# Patient Record
Sex: Female | Born: 1990 | Race: White | Hispanic: No | Marital: Single | Smoking: Former smoker
Health system: Southern US, Community
[De-identification: ages and names within clinical notes are randomized; demographics above are authoritative.]

## PROBLEM LIST (undated history)

## (undated) ENCOUNTER — Inpatient Hospital Stay: Payer: Self-pay

## (undated) DIAGNOSIS — N189 Chronic kidney disease, unspecified: Secondary | ICD-10-CM

## (undated) DIAGNOSIS — F419 Anxiety disorder, unspecified: Secondary | ICD-10-CM

## (undated) DIAGNOSIS — F1910 Other psychoactive substance abuse, uncomplicated: Secondary | ICD-10-CM

## (undated) DIAGNOSIS — F32A Depression, unspecified: Secondary | ICD-10-CM

## (undated) DIAGNOSIS — F329 Major depressive disorder, single episode, unspecified: Secondary | ICD-10-CM

## (undated) DIAGNOSIS — F99 Mental disorder, not otherwise specified: Secondary | ICD-10-CM

## (undated) HISTORY — PX: NO PAST SURGERIES: SHX2092

---
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
## 2013-12-10 ENCOUNTER — Observation Stay: Payer: Self-pay

## 2013-12-10 LAB — URINALYSIS, COMPLETE
Glucose,UR: NEGATIVE mg/dL (ref 0–75)
Nitrite: NEGATIVE
WBC UR: 1 /HPF (ref 0–5)

## 2013-12-11 LAB — URINE CULTURE

## 2013-12-27 ENCOUNTER — Observation Stay: Payer: Self-pay | Admitting: Emergency Medicine

## 2014-01-02 LAB — OB RESULTS CONSOLE RUBELLA ANTIBODY, IGM: Rubella: IMMUNE

## 2014-01-09 LAB — OB RESULTS CONSOLE VARICELLA ZOSTER ANTIBODY, IGG: Varicella: IMMUNE

## 2014-01-21 ENCOUNTER — Observation Stay: Payer: Self-pay

## 2014-01-22 ENCOUNTER — Observation Stay: Payer: Self-pay

## 2014-01-24 ENCOUNTER — Inpatient Hospital Stay: Payer: Self-pay | Admitting: Obstetrics and Gynecology

## 2014-01-24 LAB — CBC WITH DIFFERENTIAL/PLATELET
Basophil #: 0 10*3/uL (ref 0.0–0.1)
Basophil %: 0.2 %
EOS ABS: 0.1 10*3/uL (ref 0.0–0.7)
Eosinophil %: 0.8 %
HCT: 30.5 % — ABNORMAL LOW (ref 35.0–47.0)
HGB: 10.2 g/dL — AB (ref 12.0–16.0)
LYMPHS ABS: 1.4 10*3/uL (ref 1.0–3.6)
LYMPHS PCT: 15.8 %
MCH: 28.5 pg (ref 26.0–34.0)
MCHC: 33.5 g/dL (ref 32.0–36.0)
MCV: 85 fL (ref 80–100)
MONO ABS: 0.6 x10 3/mm (ref 0.2–0.9)
MONOS PCT: 6.2 %
Neutrophil #: 7 10*3/uL — ABNORMAL HIGH (ref 1.4–6.5)
Neutrophil %: 77 %
PLATELETS: 141 10*3/uL — AB (ref 150–440)
RBC: 3.59 10*6/uL — AB (ref 3.80–5.20)
RDW: 17.4 % — AB (ref 11.5–14.5)
WBC: 9.1 10*3/uL (ref 3.6–11.0)

## 2014-01-24 LAB — GC/CHLAMYDIA PROBE AMP

## 2014-01-26 LAB — HEMATOCRIT: HCT: 29.5 % — AB (ref 35.0–47.0)

## 2014-06-29 ENCOUNTER — Emergency Department: Payer: Self-pay | Admitting: Emergency Medicine

## 2014-06-29 LAB — CBC
HCT: 37.8 % (ref 35.0–47.0)
HGB: 12.4 g/dL (ref 12.0–16.0)
MCH: 27.5 pg (ref 26.0–34.0)
MCHC: 32.7 g/dL (ref 32.0–36.0)
MCV: 84 fL (ref 80–100)
Platelet: 226 10*3/uL (ref 150–440)
RBC: 4.5 10*6/uL (ref 3.80–5.20)
RDW: 13.5 % (ref 11.5–14.5)
WBC: 6 10*3/uL (ref 3.6–11.0)

## 2014-06-29 LAB — HCG, QUANTITATIVE, PREGNANCY: Beta Hcg, Quant.: 4738 m[IU]/mL — ABNORMAL HIGH

## 2014-08-22 ENCOUNTER — Emergency Department: Payer: Self-pay | Admitting: Emergency Medicine

## 2014-08-22 LAB — COMPREHENSIVE METABOLIC PANEL
ALT: 14 U/L
AST: 16 U/L (ref 15–37)
Albumin: 3.7 g/dL (ref 3.4–5.0)
Alkaline Phosphatase: 62 U/L
Anion Gap: 6 — ABNORMAL LOW (ref 7–16)
BILIRUBIN TOTAL: 0.6 mg/dL (ref 0.2–1.0)
BUN: 8 mg/dL (ref 7–18)
CHLORIDE: 106 mmol/L (ref 98–107)
Calcium, Total: 8.4 mg/dL — ABNORMAL LOW (ref 8.5–10.1)
Co2: 24 mmol/L (ref 21–32)
Creatinine: 0.76 mg/dL (ref 0.60–1.30)
EGFR (African American): >60
EGFR (Non-African Amer.): >60
Glucose: 102 mg/dL — ABNORMAL HIGH (ref 65–99)
Osmolality: 270 (ref 275–301)
Potassium: 3.5 mmol/L (ref 3.5–5.1)
SODIUM: 136 mmol/L (ref 136–145)
TOTAL PROTEIN: 7.2 g/dL (ref 6.4–8.2)

## 2014-08-22 LAB — CBC WITH DIFFERENTIAL/PLATELET
Basophil #: 0 10*3/uL (ref 0.0–0.1)
Basophil %: 0.5 %
Eosinophil #: 0 10*3/uL (ref 0.0–0.7)
Eosinophil %: 0 %
HCT: 38.8 % (ref 35.0–47.0)
HGB: 12.9 g/dL (ref 12.0–16.0)
LYMPHS ABS: 1.1 10*3/uL (ref 1.0–3.6)
Lymphocyte %: 11.7 %
MCH: 28.6 pg (ref 26.0–34.0)
MCHC: 33.2 g/dL (ref 32.0–36.0)
MCV: 86 fL (ref 80–100)
MONO ABS: 0.5 x10 3/mm (ref 0.2–0.9)
Monocyte %: 5.2 %
Neutrophil #: 7.8 10*3/uL — ABNORMAL HIGH (ref 1.4–6.5)
Neutrophil %: 82.6 %
Platelet: 157 10*3/uL (ref 150–440)
RBC: 4.51 10*6/uL (ref 3.80–5.20)
RDW: 13.5 % (ref 11.5–14.5)
WBC: 9.4 10*3/uL (ref 3.6–11.0)

## 2014-08-22 LAB — URINALYSIS, COMPLETE
Bacteria: NONE SEEN
Bilirubin,UR: NEGATIVE
Blood: NEGATIVE
GLUCOSE, UR: NEGATIVE mg/dL (ref 0–75)
KETONE: NEGATIVE
LEUKOCYTE ESTERASE: NEGATIVE
Nitrite: NEGATIVE
Ph: 7 (ref 4.5–8.0)
Protein: NEGATIVE
RBC,UR: 1 /HPF (ref 0–5)
Specific Gravity: 1.019 (ref 1.003–1.030)
Squamous Epithelial: 1

## 2014-08-22 LAB — PREGNANCY, URINE: Pregnancy Test, Urine: NEGATIVE m[IU]/mL

## 2014-10-16 ENCOUNTER — Emergency Department: Payer: Self-pay | Admitting: Student

## 2014-10-16 LAB — URINALYSIS, COMPLETE
BILIRUBIN, UR: NEGATIVE
BLOOD: NEGATIVE
Glucose,UR: NEGATIVE mg/dL (ref 0–75)
KETONE: NEGATIVE
Nitrite: NEGATIVE
Ph: 6 (ref 4.5–8.0)
Protein: NEGATIVE
RBC,UR: 1 /HPF (ref 0–5)
SPECIFIC GRAVITY: 1.015 (ref 1.003–1.030)
WBC UR: 17 /HPF (ref 0–5)

## 2014-10-16 LAB — WET PREP, GENITAL

## 2014-10-16 LAB — CBC WITH DIFFERENTIAL/PLATELET
BASOS PCT: 0.5 %
Basophil #: 0 10*3/uL (ref 0.0–0.1)
Eosinophil #: 0 10*3/uL (ref 0.0–0.7)
Eosinophil %: 0.7 %
HCT: 40.7 % (ref 35.0–47.0)
HGB: 13.3 g/dL (ref 12.0–16.0)
LYMPHS ABS: 2.2 10*3/uL (ref 1.0–3.6)
Lymphocyte %: 31 %
MCH: 28 pg (ref 26.0–34.0)
MCHC: 32.8 g/dL (ref 32.0–36.0)
MCV: 85 fL (ref 80–100)
Monocyte #: 0.5 x10 3/mm (ref 0.2–0.9)
Monocyte %: 7.5 %
Neutrophil #: 4.4 10*3/uL (ref 1.4–6.5)
Neutrophil %: 60.3 %
PLATELETS: 232 10*3/uL (ref 150–440)
RBC: 4.76 10*6/uL (ref 3.80–5.20)
RDW: 13.3 % (ref 11.5–14.5)
WBC: 7.2 10*3/uL (ref 3.6–11.0)

## 2014-10-16 LAB — COMPREHENSIVE METABOLIC PANEL
ALT: 18 U/L
Albumin: 3.8 g/dL (ref 3.4–5.0)
Alkaline Phosphatase: 64 U/L
Anion Gap: 7 (ref 7–16)
BUN: 6 mg/dL — ABNORMAL LOW (ref 7–18)
Bilirubin,Total: 0.2 mg/dL (ref 0.2–1.0)
CALCIUM: 8.3 mg/dL — AB (ref 8.5–10.1)
CHLORIDE: 107 mmol/L (ref 98–107)
Co2: 25 mmol/L (ref 21–32)
Creatinine: 0.53 mg/dL — ABNORMAL LOW (ref 0.60–1.30)
EGFR (African American): >60
EGFR (Non-African Amer.): >60
Glucose: 102 mg/dL — ABNORMAL HIGH (ref 65–99)
Osmolality: 275 (ref 275–301)
Potassium: 3.7 mmol/L (ref 3.5–5.1)
SGOT(AST): 21 U/L (ref 15–37)
Sodium: 139 mmol/L (ref 136–145)
Total Protein: 7.1 g/dL (ref 6.4–8.2)

## 2014-10-16 LAB — HCG, QUANTITATIVE, PREGNANCY: BETA HCG, QUANT.: 17565 m[IU]/mL — AB

## 2014-10-22 LAB — GC/CHLAMYDIA PROBE AMP

## 2015-05-05 NOTE — H&P (Signed)
L&D Evaluation:  History:  HPI Pt is a 24 yo G1P0 at 4717w0d, EDD of 01/29/14, presents with c/o regular ctx after having membranes stripped in the office today. She reports +FM, denies vb. She was seen in L&D for contractions on 01/21/14 as well after having her membranes stripped in the office that day as well.   [facility] at [facility], initial care in [place] (no records available.) H/o Kidney infection and C. Difficile colitis earlier in pregnancy. She was hospitalized several times in September in [place]. Baby up for adoption.   Presents with contractions   Patient's Medical History anxiety   Patient's Surgical History none   Medications Pre Natal Vitamins   Allergies NKDA   Social History none   Family History Non-Contributory   Exam:  Vital Signs stable   General no apparent distress   Mental Status clear   Chest clear   Heart normal sinus rhythm   Abdomen gravid, tender with contractions   Back no CVAT   Edema no edema   Pelvic no external lesions, by RN 2/70/-2 posterior   Mebranes Intact   FHT normal rate with no decels, 135 +accels   Ucx regular, q 5-9 min   Skin dry, no lesions, no rashes   Lymph no lymphadenopathy   Impression:  Impression IUP at 39 weeks   Plan:  Plan EFM/NST, monitor contractions and for cervical change, pt to ambulate, will assess for cervical change. Dispo pending next cervical exam   Comments Pt discharged- cervical exam unchanged after ambulating for over an hour. Pt given labor precautions and sent home.   Follow Up Appointment already scheduled   Electronic Signatures: Jannet MantisSubudhi, Texie Tupou (CNM)  (Signed 28-Jan-15 21:04)  Authored: L&D Evaluation   Last Updated: 28-Jan-15 21:04 by Jannet MantisSubudhi, Altin Sease (CNM)

## 2015-05-05 NOTE — H&P (Signed)
L&D Evaluation:  History:  HPI Pt is a 24 yo G1P0 at 9752w2d, EDD of 01/29/14, presents with SROM clear fluid. +FM, no VB, irregular ctx.  She reports +FM, denies vb. She was seen in L&D for contractions on 01/21/14 as well after having her membranes stripped in the office that day as well.   [facility], initial care in [place] (no records available.) H/o Kidney infection and C. Difficile colitis earlier in pregnancy. She was hospitalized several times in September in [place]. Baby up for adoption.   Presents with leaking fluid   Patient's Medical History anxiety   Patient's Surgical History none   Medications Pre Natal Vitamins   Allergies NKDA   Social History none   Family History Non-Contributory   Exam:  Vital Signs stable   General no apparent distress   Mental Status clear   Chest clear   Heart normal sinus rhythm   Abdomen gravid, tender with contractions   Back no CVAT   Edema no edema   Pelvic no external lesions, by RN 2-3/70/-2 posterior   Mebranes Ruptured, grossly ruptured   FHT normal rate with no decels, 135-140, moderate, positive accels, no decels   Ucx regular, q 6 min   Skin dry, no lesions, no rashes   Lymph no lymphadenopathy   Impression:  Impression IUP at 39.2 weeks presenting with SROM   Plan:  Plan EFM/NST, monitor contractions and for cervical change   Comments 1) SROM - monitor if contraction patter does not increase pitocin augmentation for PROM  2) Fetus - category I tracing     - US EFW 3220g on 01/21/14     - wt gain 33lbs      - no 1-hr OGTT on records but low risk and appropriate growth as documented above  3) PNL: O positive / ABSC neg / RI / VNI / HIV neg / HBsAg neg / RPR NR / GC&CT negative GBS negative     - needs varivax postpartum  4) TDAP - needs if did not receive in [place]  5) Disposition - anticipate vaginal delivery   Follow Up Appointment need to schedule   Electronic  Signatures: Lorrene ReidStaebler, Travis Mastel M (MD)  (Signed 30-Jan-15 13:27)  Authored: L&D Evaluation   Last Updated: 30-Jan-15 13:27 by Lorrene ReidStaebler, Natacha Jepsen M (MD)

## 2015-05-05 NOTE — H&P (Signed)
L&D Evaluation:  History:  HPI Pt is a 24 yo G1P0 at 33 weeks today who presents after waking up this morning with a feeling of "wetness" on her underwear. Pt also reports some cramping but has had the same type of cramping for the last 4 weeks. She reports +FM, denies vb. 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 Per records available she is O+, gbs unknown. Her prenatal care has been significant for Kidney infection and C. Difficile colitis. She was hospitalized several times in September.   Presents with leaking fluid   Patient's Medical History anxiety,   Patient's Surgical History none   Medications Pre Natal Vitamins   Allergies NKDA   Social History none   Family History Non-Contributory   Exam:  Vital Signs stable   General no apparent distress   Mental Status clear   Chest clear   Heart normal sinus rhythm   Abdomen gravid, non-tender   Back no CVAT   Edema no edema   Pelvic no external lesions, by RN 1/50/-2   Mebranes Intact, ROM+ positive, however nitrazine and fern negative   FHT normal rate with no decels, 140's +accels to 190's   Ucx absent   Skin dry, no lesions, no rashes   Lymph no lymphadenopathy   Other Urine- negative ketones, nitrites,blood, leukocytes, SG 1.004, 1 WBC, 3+ bacteria, +mucus, 1 epithelial cell Wet mount- negative clue, whiff, yeast buds or pseudohyphae seen Fern negative   Impression:  Impression UTI, reactive NST, IUP at 33 weeks, probable UTI, reactive NST,   Plan:  Plan discharge, abx rx sent home with pt, f/u in office tomorrow, urine sent for culture.   Follow Up Appointment need to schedule. already scheduled   Electronic Signatures: Jannet MantisSubudhi, Jonie Burdell (CNM)  (Signed 16-Dec-14 11:44)  Authored: L&D Evaluation   Last Updated: 16-Dec-14 11:44 by Jannet MantisSubudhi, Nessa Ramaker (CNM)

## 2015-05-05 NOTE — H&P (Signed)
L&D Evaluation:  History Expanded:  HPI Pt is a 24 yo G1P0 at 3951w6d, EDD of 01/29/14, presents with c/o regular ctx after having membranes stripped in the office today. She reports +FM, denies vb.   [facility], initial care in [place] (no records available.) H/o Kidney infection and C. Difficile colitis earlier in pregnancy. She was hospitalized several times in September in [place]. Baby up for adoption.   Blood Type (Maternal) O positive   Group B Strep Results Maternal (Result >5wks must be treated as unknown) negative   Maternal HIV Negative   Maternal Syphilis Ab Nonreactive   Maternal Varicella Non-Immune   Rubella Results (Maternal) immune   Presents with contractions   Patient's Medical History anxiety   Patient's Surgical History none   Medications Pre Natal Vitamins   Allergies NKDA   Social History none   Family History Non-Contributory   Exam:  Vital Signs stable   General no apparent distress   Mental Status clear   Chest clear   Heart normal sinus rhythm   Abdomen gravid, tender with contractions   Back no CVAT   Edema no edema   Pelvic no external lesions, 2/50/-2   Mebranes Intact   Ucx regular, q 5 min   Skin dry, no lesions, no rashes   Lymph no lymphadenopathy   Plan:  Comments FHR with spontaneous deceleration down to 70s at the lowest with slow return to baseline over 10 minutes. FHR currently stable with baseline in 130, + accelerations. Will continue to monitor overnight. Pt has growth US scheduled tomorrow, will plan to order here. Dr Bonney AidStaebler notified of pt's status.   Electronic Signatures: Hailey Owens, Hailey Owens (CNM)  (Signed 27-Jan-15 02:34)  Authored: L&D Evaluation   Last Updated: 27-Jan-15 02:34 by Hailey Owens, Jaxin Fulfer Owens (CNM)

## 2015-12-27 NOTE — L&D Delivery Note (Signed)
Delivery Note At 6:40 PM a viable female infant was delivered via Vaginal, Spontaneous Delivery (Presentation: Right Occiput Anterior).  APGAR: 8, 9; weight pending .   Placenta status: Intact, Spontaneous.  Cord: 3 vessels with the following complications: Short.  Cord pH: n/a  Anesthesia: Epidural  Episiotomy: None Lacerations: None Suture Repair: n/a Est. Blood Loss (mL): 300  Mom to postpartum.  Baby to Couplet care / Skin to Skin.  Called to see patient.  Mom pushed to delivery viable female infant.  The head followed by shoulders, which delivered without difficulty, and the rest of the body.  No nuchal cord noted.  Baby to mom's chest.  Cord clamped and cut after > 1 min delay.  No cord blood obtained.  Placenta delivered spontaneously, intact, with a 3-vessel cord.  Second degree perineal laceration repaired with 3-0 Vicryl in standard fashion.  All counts correct.  Hemostasis obtained with IV pitocin and fundal massage. EBL 300 mL.     Conard NovakJackson, Savon Cobbs D, MD 04/08/2016, 6:54 PM

## 2016-01-25 LAB — OB RESULTS CONSOLE HIV ANTIBODY (ROUTINE TESTING): HIV: NONREACTIVE

## 2016-01-25 LAB — OB RESULTS CONSOLE RPR: RPR: NONREACTIVE

## 2016-02-12 LAB — OB RESULTS CONSOLE GC/CHLAMYDIA
Chlamydia: NEGATIVE
Gonorrhea: NEGATIVE

## 2016-02-23 ENCOUNTER — Observation Stay
Admission: EM | Admit: 2016-02-23 | Discharge: 2016-02-23 | Disposition: A | Discharge status: Home / Self Care | Payer: Medicaid Other | Attending: Obstetrics and Gynecology | Admitting: Obstetrics and Gynecology

## 2016-02-23 ENCOUNTER — Encounter: Payer: Self-pay | Admitting: *Deleted

## 2016-02-23 DIAGNOSIS — O26893 Other specified pregnancy related conditions, third trimester: Principal | ICD-10-CM | POA: Insufficient documentation

## 2016-02-23 DIAGNOSIS — R109 Unspecified abdominal pain: Secondary | ICD-10-CM | POA: Insufficient documentation

## 2016-02-23 DIAGNOSIS — R102 Pelvic and perineal pain: Secondary | ICD-10-CM | POA: Diagnosis not present

## 2016-02-23 DIAGNOSIS — Z3A32 32 weeks gestation of pregnancy: Secondary | ICD-10-CM | POA: Diagnosis not present

## 2016-02-23 DIAGNOSIS — O26899 Other specified pregnancy related conditions, unspecified trimester: Secondary | ICD-10-CM

## 2016-02-23 HISTORY — DX: Anxiety disorder, unspecified: F41.9

## 2016-02-23 MED ORDER — ACETAMINOPHEN 325 MG PO TABS: 650.0000 mg | ORAL_TABLET | ORAL | Status: DC | PRN

## 2016-02-23 NOTE — Final Progress Note (Signed)
Physician Final Progress Note  Patient ID: Hailey Owens MRN: [id number] DOB/AGE: [birth year] 24 y.o.  Admit date: 02/23/2016 Admitting provider: Vena Austria, MD Discharge date: 02/23/2016   Admission Diagnoses: Abdominal pain in pregnancy  Discharge Diagnoses:  Active Problems:   Abdominal pain affecting pregnancy Round ligament pain  24 yo G4P1021 at [redacted]w[redacted]d by stated EDC of 04/15/2016, transfer of care from [facility] awaiting first prenatal visit and [facility] presenting with groin pain radiating into her vagina.  Pain is sharp and brief in quality, has not noted positional exacerbation but worse when coughing.  No other symptoms.  +FM, no LOF, no VB, no ctx  Consults: None  Significant Findings/ Diagnostic Studies: reactive NST  Procedures: NST 130, moderate variability, +accels, no decels, some uterine irritability  Discharge Condition: good  Disposition:   Diet: Regular diet  Discharge Activity: Activity as tolerated  Discharge Instructions    Discharge activity:  No Restrictions    Complete by:  As directed      Discharge diet:  No restrictions    Complete by:  As directed      Fetal Kick Count:  Lie on our left side for one hour after a meal, and count the number of times your baby kicks.  If it is less than 5 times, get up, move around and drink some juice.  Repeat the test 30 minutes later.  If it is still less than 5 kicks in an hour, notify your doctor.    Complete by:  As directed      Notify physician for a general feeling that "something is not right"    Complete by:  As directed      Notify physician for increase or change in vaginal discharge    Complete by:  As directed      Notify physician for intestinal cramps, with or without diarrhea, sometimes described as "gas pain"    Complete by:  As directed      Notify physician for leaking of fluid    Complete by:  As directed      Notify physician for low, dull backache, unrelieved by heat or Tylenol     Complete by:  As directed      Notify physician for menstrual like cramps    Complete by:  As directed      Notify physician for pelvic pressure    Complete by:  As directed      Notify physician for uterine contractions.  These may be painless and feel like the uterus is tightening or the baby is  "balling up"    Complete by:  As directed      Notify physician for vaginal bleeding    Complete by:  As directed      PRETERM LABOR:  Includes any of the follwing symptoms that occur between 20 - [redacted] weeks gestation.  If these symptoms are not stopped, preterm labor can result in preterm delivery, placing your baby at risk    Complete by:  As directed             Medication List    TAKE these medications        busPIRone 7.5 MG tablet  Commonly known as:  BUSPAR  Take 7.5 mg by mouth 2 (two) times daily.     hydrOXYzine 50 MG tablet  Commonly known as:  ATARAX/VISTARIL  Take 50 mg by mouth every 4 (four) hours as needed.         Total  time spent taking care of this patient: 15 minutes  Signed: Lorrene Reid 02/23/2016, 3:21 PM

## 2016-02-23 NOTE — OB Triage Note (Signed)
Complaining of intermittent abdominal pain that woke patient at 1130 AM. Cramping occurs every 2-3  Minutes per patient. Gush of fluid yesterday 1600 none since. 8/10 pain with cramping. Spotting x1 this morning. Afebrile.

## 2016-03-17 LAB — OB RESULTS CONSOLE GBS: GBS: NEGATIVE

## 2016-03-20 ENCOUNTER — Observation Stay
Admission: EM | Admit: 2016-03-20 | Discharge: 2016-03-21 | Disposition: A | Discharge status: Home / Self Care | Payer: Medicaid Other | Attending: Obstetrics and Gynecology | Admitting: Obstetrics and Gynecology

## 2016-03-20 DIAGNOSIS — O99343 Other mental disorders complicating pregnancy, third trimester: Secondary | ICD-10-CM | POA: Diagnosis not present

## 2016-03-20 DIAGNOSIS — Z87891 Personal history of nicotine dependence: Secondary | ICD-10-CM | POA: Insufficient documentation

## 2016-03-20 DIAGNOSIS — F419 Anxiety disorder, unspecified: Secondary | ICD-10-CM | POA: Diagnosis not present

## 2016-03-20 DIAGNOSIS — F410 Panic disorder [episodic paroxysmal anxiety] without agoraphobia: Secondary | ICD-10-CM | POA: Insufficient documentation

## 2016-03-20 DIAGNOSIS — F603 Borderline personality disorder: Secondary | ICD-10-CM | POA: Diagnosis not present

## 2016-03-20 DIAGNOSIS — Z3A36 36 weeks gestation of pregnancy: Secondary | ICD-10-CM | POA: Insufficient documentation

## 2016-03-20 HISTORY — DX: Mental disorder, not otherwise specified: F99

## 2016-03-20 HISTORY — DX: Other psychoactive substance abuse, uncomplicated: F19.10

## 2016-03-20 MED ORDER — LACTATED RINGERS IV BOLUS (SEPSIS)
1000.0000 mL | Freq: Once | INTRAVENOUS | Status: AC
Start: 2016-03-20 — End: 2016-03-21
  Administered 2016-03-20: 1000 mL via INTRAVENOUS

## 2016-03-20 MED ORDER — BETAMETHASONE SOD PHOS & ACET 6 (3-3) MG/ML IJ SUSP
INTRAMUSCULAR | Status: AC
  Administered 2016-03-20: 12 mg via INTRAMUSCULAR
  Filled 2016-03-20: qty 1

## 2016-03-20 MED ORDER — TERBUTALINE SULFATE 1 MG/ML IJ SOLN
0.2500 mg | INTRAMUSCULAR | Status: AC
  Administered 2016-03-20: 0.25 mg via SUBCUTANEOUS
  Filled 2016-03-20: qty 1

## 2016-03-20 MED ORDER — BETAMETHASONE SOD PHOS & ACET 6 (3-3) MG/ML IJ SUSP: 12.0000 mg | Freq: Once | INTRAMUSCULAR | Status: DC

## 2016-03-20 MED ORDER — BETAMETHASONE SOD PHOS & ACET 6 (3-3) MG/ML IJ SUSP
12.0000 mg | Freq: Once | INTRAMUSCULAR | Status: AC
  Administered 2016-03-20: 12 mg via INTRAMUSCULAR

## 2016-03-20 NOTE — OB Triage Note (Signed)
Pt. reports cx starting approximately three days ago; states contractions are currently occuring every three to five minutes. States "I think I peed on myself earlier;" denies VB. Moved to this area from [place] last week; states she has spoken with Dr. Bonney AidStaebler regarding initiating care with [facility] (she is a former patient). Does not currently have prenatal records available. States she is trying to get in touch with her former provider to arrange for transfer of records.

## 2016-03-21 ENCOUNTER — Encounter: Payer: Self-pay | Admitting: Obstetrics and Gynecology

## 2016-03-21 LAB — URINALYSIS COMPLETE WITH MICROSCOPIC (ARMC ONLY)
BILIRUBIN URINE: NEGATIVE
GLUCOSE, UA: NEGATIVE mg/dL
KETONES UR: NEGATIVE mg/dL
NITRITE: NEGATIVE
Protein, ur: NEGATIVE mg/dL
SPECIFIC GRAVITY, URINE: 1.01 (ref 1.005–1.030)
pH: 6 (ref 5.0–8.0)

## 2016-03-21 LAB — URINE DRUG SCREEN, QUALITATIVE (ARMC ONLY)
Amphetamines, Ur Screen: NOT DETECTED
Barbiturates, Ur Screen: NOT DETECTED
Benzodiazepine, Ur Scrn: NOT DETECTED
CANNABINOID 50 NG, UR ~~LOC~~: NOT DETECTED
COCAINE METABOLITE, UR ~~LOC~~: NOT DETECTED
MDMA (ECSTASY) UR SCREEN: NOT DETECTED
Methadone Scn, Ur: NOT DETECTED
OPIATE, UR SCREEN: NOT DETECTED
Phencyclidine (PCP) Ur S: NOT DETECTED
TRICYCLIC, UR SCREEN: NOT DETECTED

## 2016-03-21 LAB — TYPE AND SCREEN
ABO/RH(D): O POS
Antibody Screen: NEGATIVE

## 2016-03-21 LAB — CHLAMYDIA/NGC RT PCR (ARMC ONLY)
Chlamydia Tr: NOT DETECTED
N GONORRHOEAE: NOT DETECTED

## 2016-03-21 LAB — ABO/RH: ABO/RH(D): O POS

## 2016-03-21 MED ORDER — BETAMETHASONE SOD PHOS & ACET 6 (3-3) MG/ML IJ SUSP
12.0000 mg | INTRAMUSCULAR | Status: AC
  Administered 2016-03-21: 12 mg via INTRAMUSCULAR
  Filled 2016-03-21: qty 2

## 2016-03-21 NOTE — H&P (Signed)
OB History & Physical   History of Present Illness:  Chief Complaint: contractions  HPI:  Hailey Owens is a 25 y.o. [phone] female at [redacted]w[redacted]d dated by LMP consistent with 7 week ultrasound.  Her pregnancy has been complicated by history of panic attacks (currently on Buspar), UTI in second trimester (treated), chlamydia (treated 11/8 has had negative TOC).    She has received her prenatal care from [facility] She has moved to [place] recently (yesterday) and is transferring her prenatal care to [facility]. She reports contractions for the past several days. She has a note from 3/23 in clinic at [facility] where she has received most of her care.  She was 1cm at that visit.  She has continued to have contractions for the past three days and they are getting stronger. She has no history of preterm labor.   She denies leakage of fluid.   She denies vaginal bleeding.   She reports fetal movement.    Maternal Medical History:   Past Medical History  Diagnosis Date  . Anxiety     with panic attacks  . Substance abuse     EtOH abuse per patient records, last drink in 07/2015  . Mental disorder     borderline personality disorder, PTDS, anxiety,  depression    Past Surgical History  Procedure Laterality Date  . No past surgeries      Allergies  Allergen Reactions  . Blueberry [Vaccinium Angustifolium] Hives    Prior to Admission medications   Medication Sig Start Date End Date Taking? Authorizing Provider  Prenatal Vit-Fe Fumarate-FA (PRENATAL MULTIVITAMIN) TABS tablet Take 1 tablet by mouth daily at 12 noon.   Yes Historical Provider, MD  busPIRone (BUSPAR) 7.5 MG tablet Take 7.5 mg by mouth 2 (two) times daily.    Historical Provider, MD  hydrOXYzine (ATARAX/VISTARIL) 50 MG tablet Take 50 mg by mouth every 4 (four) hours as needed.    Historical Provider, MD    OB History  Gravida Para Term Preterm AB SAB TAB Ectopic  Multiple Living  # Outcome Date GA Lbr Len/2nd Weight Sex Delivery Anes PTL Lv  4 Current           3 Term 01/25/14    M Vag-Spont   Y     Comments: adoption  2 TAB           1 AB               Prenatal care site: [facility], transferring her care to [facility] as she is moving to the area.  Social History: She  reports that she quit smoking about 9 months ago. Her smoking use included Cigarettes. She does not have any smokeless tobacco history on file. She reports that she does not drink alcohol or use illicit drugs.  Family History: family history includes Anxiety disorder in her father and mother; Depression in her father; Diabetes in her father and paternal grandfather; Drug abuse in her mother; Hypertension in her paternal grandfather; Seizures in her maternal aunt; Stroke in her father.   Review of Systems: Negative x 10 systems reviewed except as noted in the HPI.    Physical Exam:  Vital Signs: BP 114/79 mmHg  Pulse 69  Temp(Src) 98.2 F (36.8 C) (Oral)  Resp 20  Ht  (1.575 m)  Wt 61.236 kg (  135 lb)  BMI 24.69 kg/m2 General: no acute distress.  HEENT: normocephalic, atraumatic Heart: regular rate & rhythm.  No murmurs/rubs/gallops Lungs: clear to auscultation bilaterally Abdomen: soft, gravid, non-tender; EFW 5 pounds Pelvic:   External: Normal external female genitalia  Cervix: Dilation: 2 / Effacement (%): 60 / Station: -2  Extremities: non-tender, symmetric, no edema bilaterally.    Neurologic: Alert & oriented x 3.    Pertinent Results:  Prenatal Labs: Blood type/Rh O positive  Antibody screen negative  Rubella Immune  Varicella Immune    RPR NR  HBsAg negative  HIV negative  GC negative  Chlamydia Positive in [place], negative in 01/2016  Genetic screening Did not get  1 hour GTT 97  3 hour GTT n/a  GBS negative on 03/17/16  Anatomy U/S Documented on 01/01/16. No specifics given. Only that u/s is  complete and sex is female.    Baseline FHR: 130 beats/min   Variability: moderate   Accelerations: present   Decelerations: absent Contractions: present frequency: 3 q 10 minutes Overall assessment: category 1   Assessment:  Hailey Owens is a 25 y.o. 764P1021 female at 3341w3d with preterm labor.   Plan:  1. Admit to Labor & Delivery  2. CBC, T&S, Clrs, IVF 3. GBS negative.   4. Fetwal well-being: reassuring 5. Preterm labor: s/p single dose of terbutaline that did slow contractions for a while and they have returned. She is status post a bolus of 1 liter of IV fluid now on MIVF. S/p BMTZ x 1 dose so far. Expectant management from this point.  Will send urine for UA, UDS.    Conard NovakJackson, Halea Lieb D, MD 03/21/2016 2:22 AM

## 2016-03-21 NOTE — Discharge Summary (Signed)
Subjective:  Patient continues to state she has frequent and painful contractions.  Objective: BP 103/61 mmHg  Pulse 100  Temp(Src) 97.9 F (36.6 C) (Oral)  Resp 18  Ht 5\' 2"  (1.575 m)  Wt 61.236 kg (135 lb)  BMI 24.69 kg/m2  Gen: NAD SVE: 2/50/-3 (no change since arrival)  A/P: 25 y.o. [id number] female at 4873w3d with preterm contractions, no evidence of labor 1) PTL: second dose of BMTZ prior to discharge. 2) FWB: reassuring, category 1 while here. 3) discharge home with precautions to return, if contractions worsen or she feels more pelvic pressure, thinks her water breaks, etc. 4) follow-up: establish care locally ASAP.  Thomasene MohairStephen Geneal Huebert, MD 03/21/2016 8:03 AM

## 2016-03-21 NOTE — Discharge Summary (Signed)
Patient discharged home, discharge instructions given, patient states understanding. Patient left floor in stable condition, denies any other needs at this time. Patient to schedule OB appointment ASAP

## 2016-03-26 ENCOUNTER — Inpatient Hospital Stay
Admission: AD | Admit: 2016-03-26 | Discharge: 2016-03-26 | Disposition: A | Discharge status: Home / Self Care | Payer: Medicaid Other | Source: Ambulatory Visit | Attending: Obstetrics and Gynecology | Admitting: Obstetrics and Gynecology

## 2016-03-26 DIAGNOSIS — Z3A37 37 weeks gestation of pregnancy: Secondary | ICD-10-CM | POA: Insufficient documentation

## 2016-03-26 HISTORY — DX: Major depressive disorder, single episode, unspecified: F32.9

## 2016-03-26 HISTORY — DX: Depression, unspecified: F32.A

## 2016-03-26 HISTORY — DX: Chronic kidney disease, unspecified: N18.9

## 2016-03-26 NOTE — Final Progress Note (Signed)
Physician Final Progress Note  Patient ID: Hailey Owens MRN: [id number] DOB/AGE: [birth year] 25 y.o.  Admit date: 03/26/2016 Admitting provider: Barnwell Bingharlie Pickens, MD Discharge date: 03/26/2016   Admission Diagnoses: Contractions at 37.1 weeks  Discharge Diagnoses:  Contractions at 37.1 weeks  Consults:none  Significant Findings/ Diagnostic Studies: 25 year old 244 37P1021 with an EDC=04/15/2016 presents at 37.1 weeks with c/o contractions since 1300 today. Had some spotting earlier today and some loose stools. Cervix on arrival was 2-2.5/ 60%/ -1 and posterior and there was no change after 2.5 hours. Contractions prior to discharge were q7-8 minutes and mild. FHR was nicely reactive with a baseline 130s with accelerations to 160s. Patient was discharged home with labor precautions.  Procedures: Non stress test reactive  Discharge Condition: stable  Disposition: 81-Discharged to home/self-care with a planned acute care hospital inpt readmission  Diet: Regular diet  Discharge Activity: Activity as tolerated     Medication List    ASK your doctor about these medications        busPIRone 7.5 MG tablet  Commonly known as:  BUSPAR  Take 7.5 mg by mouth 2 (two) times daily.     hydrOXYzine 50 MG tablet  Commonly known as:  ATARAX/VISTARIL  Take 50 mg by mouth every 4 (four) hours as needed.     prenatal multivitamin Tabs tablet  Take 1 tablet by mouth daily at 12 noon.         Total time spent taking care of this patient: 15 minutes  Signed: Farrel ConnersGUTIERREZ, Hoyt Leanos 03/26/2016, 10:39 PM

## 2016-03-26 NOTE — OB Triage Note (Signed)
Ctx since 1300 today. Pt. reports some spotting. Last vaginal exam was Wednesday. Denies any leaking of fluid. Lianne MorisNancy L Tiquan Bouch

## 2016-03-26 NOTE — Discharge Summary (Signed)
Reviewed discharge instructions with patient and step mother, both verbalized understanding. Copy of instructions given to patient. Stable and ambulatory upon discharge.

## 2016-04-07 ENCOUNTER — Inpatient Hospital Stay
Admit: 2016-04-07 | Discharge: 2016-04-10 | DRG: 775 | Disposition: A | Discharge status: Home / Self Care | Payer: Medicaid Other | Attending: Obstetrics & Gynecology | Admitting: Obstetrics & Gynecology

## 2016-04-07 DIAGNOSIS — Z3A39 39 weeks gestation of pregnancy: Secondary | ICD-10-CM | POA: Diagnosis not present

## 2016-04-07 DIAGNOSIS — O365990 Maternal care for other known or suspected poor fetal growth, unspecified trimester, not applicable or unspecified: Secondary | ICD-10-CM | POA: Diagnosis present

## 2016-04-07 DIAGNOSIS — Z3490 Encounter for supervision of normal pregnancy, unspecified, unspecified trimester: Secondary | ICD-10-CM

## 2016-04-07 DIAGNOSIS — O365930 Maternal care for other known or suspected poor fetal growth, third trimester, not applicable or unspecified: Secondary | ICD-10-CM | POA: Diagnosis present

## 2016-04-07 LAB — CBC
HEMATOCRIT: 31.4 % — AB (ref 35.0–47.0)
HEMOGLOBIN: 10.7 g/dL — AB (ref 12.0–16.0)
MCH: 28.5 pg (ref 26.0–34.0)
MCHC: 33.9 g/dL (ref 32.0–36.0)
MCV: 84.2 fL (ref 80.0–100.0)
Platelets: 130 10*3/uL — ABNORMAL LOW (ref 150–440)
RBC: 3.73 MIL/uL — AB (ref 3.80–5.20)
RDW: 15.7 % — ABNORMAL HIGH (ref 11.5–14.5)
WBC: 11.7 10*3/uL — ABNORMAL HIGH (ref 3.6–11.0)

## 2016-04-07 LAB — TYPE AND SCREEN
ABO/RH(D): O POS
ANTIBODY SCREEN: NEGATIVE

## 2016-04-07 MED ORDER — LACTATED RINGERS IV SOLN
INTRAVENOUS | Status: DC
  Administered 2016-04-07 – 2016-04-08 (×2): via INTRAVENOUS

## 2016-04-07 MED ORDER — OXYTOCIN BOLUS FROM INFUSION: 500.0000 mL | INTRAVENOUS | Status: DC

## 2016-04-07 MED ORDER — ACETAMINOPHEN 325 MG PO TABS: 650.0000 mg | ORAL_TABLET | ORAL | Status: DC | PRN

## 2016-04-07 MED ORDER — ZOLPIDEM TARTRATE 5 MG PO TABS
5.0000 mg | ORAL_TABLET | Freq: Every evening | ORAL | Status: DC | PRN
  Administered 2016-04-07: 5 mg via ORAL
  Filled 2016-04-07 (×2): qty 1

## 2016-04-07 MED ORDER — LIDOCAINE HCL (PF) 1 % IJ SOLN: 30.0000 mL | INTRAMUSCULAR | Status: DC | PRN

## 2016-04-07 MED ORDER — LACTATED RINGERS IV SOLN: 500.0000 mL | INTRAVENOUS | Status: DC | PRN

## 2016-04-07 MED ORDER — DINOPROSTONE 10 MG VA INST
10.0000 mg | VAGINAL_INSERT | Freq: Once | VAGINAL | Status: AC
  Administered 2016-04-07: 10 mg via VAGINAL
  Filled 2016-04-07: qty 1

## 2016-04-07 MED ORDER — ONDANSETRON HCL 4 MG/2ML IJ SOLN
4.0000 mg | Freq: Four times a day (QID) | INTRAMUSCULAR | Status: DC | PRN
  Administered 2016-04-08: 4 mg via INTRAVENOUS
  Filled 2016-04-07: qty 2

## 2016-04-07 MED ORDER — OXYTOCIN 40 UNITS IN LACTATED RINGERS INFUSION - SIMPLE MED
2.5000 [IU]/h | INTRAVENOUS | Status: DC
  Filled 2016-04-07: qty 1000

## 2016-04-08 ENCOUNTER — Inpatient Hospital Stay: Payer: Medicaid Other | Admitting: Anesthesiology

## 2016-04-08 LAB — CHLAMYDIA/NGC RT PCR (ARMC ONLY)
CHLAMYDIA TR: NOT DETECTED
N GONORRHOEAE: NOT DETECTED

## 2016-04-08 MED ORDER — DIPHENHYDRAMINE HCL 25 MG PO CAPS: 25.0000 mg | ORAL_CAPSULE | Freq: Four times a day (QID) | ORAL | Status: DC | PRN

## 2016-04-08 MED ORDER — OXYTOCIN 40 UNITS IN LACTATED RINGERS INFUSION - SIMPLE MED
1.0000 m[IU]/min | INTRAVENOUS | Status: DC
  Administered 2016-04-08: 1 m[IU]/min via INTRAVENOUS

## 2016-04-08 MED ORDER — COCONUT OIL OIL
1.0000 "application " | TOPICAL_OIL | Status: DC | PRN
  Filled 2016-04-08: qty 120

## 2016-04-08 MED ORDER — OXYTOCIN 10 UNIT/ML IJ SOLN
INTRAMUSCULAR | Status: AC
  Filled 2016-04-08: qty 2

## 2016-04-08 MED ORDER — SENNOSIDES-DOCUSATE SODIUM 8.6-50 MG PO TABS
2.0000 | ORAL_TABLET | ORAL | Status: DC
  Administered 2016-04-09 (×2): 2 via ORAL
  Filled 2016-04-08 (×2): qty 2

## 2016-04-08 MED ORDER — AMMONIA AROMATIC IN INHA
RESPIRATORY_TRACT | Status: AC
  Filled 2016-04-08: qty 10

## 2016-04-08 MED ORDER — ONDANSETRON HCL 4 MG PO TABS: 4.0000 mg | ORAL_TABLET | ORAL | Status: DC | PRN

## 2016-04-08 MED ORDER — SIMETHICONE 80 MG PO CHEW: 80.0000 mg | CHEWABLE_TABLET | ORAL | Status: DC | PRN

## 2016-04-08 MED ORDER — TERBUTALINE SULFATE 1 MG/ML IJ SOLN: 0.2500 mg | Freq: Once | INTRAMUSCULAR | Status: DC | PRN

## 2016-04-08 MED ORDER — LIDOCAINE-EPINEPHRINE (PF) 1.5 %-1:200000 IJ SOLN
INTRAMUSCULAR | Status: DC | PRN
  Administered 2016-04-08: 3 mL via PERINEURAL

## 2016-04-08 MED ORDER — WITCH HAZEL-GLYCERIN EX PADS: 1.0000 "application " | MEDICATED_PAD | CUTANEOUS | Status: DC | PRN

## 2016-04-08 MED ORDER — DIBUCAINE 1 % RE OINT
1.0000 | TOPICAL_OINTMENT | RECTAL | Status: DC | PRN
Start: 2016-04-08 — End: 2016-04-10

## 2016-04-08 MED ORDER — BUPIVACAINE HCL (PF) 0.25 % IJ SOLN
INTRAMUSCULAR | Status: DC | PRN
  Administered 2016-04-08: 5 mL via EPIDURAL
  Administered 2016-04-08: 3 mL via EPIDURAL

## 2016-04-08 MED ORDER — FERROUS SULFATE 325 (65 FE) MG PO TABS
325.0000 mg | ORAL_TABLET | Freq: Two times a day (BID) | ORAL | Status: DC
  Administered 2016-04-09 – 2016-04-10 (×3): 325 mg via ORAL
  Filled 2016-04-08 (×3): qty 1

## 2016-04-08 MED ORDER — HYDROCODONE-ACETAMINOPHEN 5-325 MG PO TABS: 1.0000 | ORAL_TABLET | ORAL | Status: DC | PRN

## 2016-04-08 MED ORDER — BENZOCAINE-MENTHOL 20-0.5 % EX AERO: 1.0000 "application " | INHALATION_SPRAY | CUTANEOUS | Status: DC | PRN

## 2016-04-08 MED ORDER — ONDANSETRON HCL 4 MG/2ML IJ SOLN: 4.0000 mg | INTRAMUSCULAR | Status: DC | PRN

## 2016-04-08 MED ORDER — LIDOCAINE HCL (PF) 1 % IJ SOLN
INTRAMUSCULAR | Status: AC
  Administered 2016-04-08: 3 mL
  Filled 2016-04-08: qty 30

## 2016-04-08 MED ORDER — FENTANYL 2.5 MCG/ML W/ROPIVACAINE 0.2% IN NS 100 ML EPIDURAL INFUSION (ARMC-ANES)
EPIDURAL | Status: AC
  Administered 2016-04-08: 9 mL/h via EPIDURAL
  Filled 2016-04-08: qty 100

## 2016-04-08 MED ORDER — IBUPROFEN 600 MG PO TABS
600.0000 mg | ORAL_TABLET | Freq: Four times a day (QID) | ORAL | Status: DC
  Administered 2016-04-09 – 2016-04-10 (×5): 600 mg via ORAL
  Filled 2016-04-08 (×6): qty 1

## 2016-04-08 MED ORDER — PRENATAL MULTIVITAMIN CH
1.0000 | ORAL_TABLET | Freq: Every day | ORAL | Status: DC
  Administered 2016-04-09: 1 via ORAL
  Filled 2016-04-08: qty 1

## 2016-04-08 MED ORDER — OXYTOCIN 40 UNITS IN LACTATED RINGERS INFUSION - SIMPLE MED: 1.0000 m[IU]/min | INTRAVENOUS | Status: DC

## 2016-04-08 MED ORDER — MISOPROSTOL 200 MCG PO TABS
ORAL_TABLET | ORAL | Status: AC
  Filled 2016-04-08: qty 4

## 2016-04-08 MED ORDER — PREDNISONE 50 MG PO TABS: 80.0000 mg | ORAL_TABLET | Freq: Every day | ORAL | Status: DC

## 2016-04-08 NOTE — Anesthesia Procedure Notes (Signed)
Epidural Patient location during procedure: OB Start time: 04/08/2016 12:31 PM End time: 04/08/2016 12:51 PM  Staffing Resident/CRNA: Cayetano Mikita Performed by: resident/CRNA   Preanesthetic Checklist Completed: patient identified, site marked, surgical consent, pre-op evaluation, timeout performed, IV checked, risks and benefits discussed and monitors and equipment checked  Epidural Patient position: sitting Prep: Betadine Patient monitoring: heart rate, continuous pulse ox and blood pressure Approach: midline Location: L4-L5 Injection technique: LOR saline  Needle:  Needle type: Tuohy  Needle gauge: 17 G Needle length: 9 cm and 9 Catheter type: closed end flexible Catheter size: 20 Guage Test dose: negative and 1.5% lidocaine with Epi 1:200 K  Assessment Events: blood not aspirated, injection not painful, no injection resistance, negative IV test and no paresthesia  Additional Notes   Patient tolerated the insertion well without complications.Reason for block:procedure for pain

## 2016-04-08 NOTE — Discharge Summary (Signed)
OB Discharge Summary  Patient Name: Hailey Owens DOB: 10-25-1991 MRN: [id number]  Date of admission: 04/07/2016 Delivering MD: Thomasene Mohair, MD Date of Delivery: 04/08/2016  Date of discharge: 04/10/16  Admitting diagnosis: 38 weeks induction, intrauterine growth restriction Intrauterine pregnancy: [redacted]w[redacted]d     Secondary diagnosis: None     Discharge diagnosis: Term Pregnancy Delivered                                                                                                Post partum procedures:none  Augmentation: AROM and Pitocin  Complications: None  Hospital course:  Induction of Labor With Vaginal Delivery   25 y.o. yo [id number] at [redacted]w[redacted]d was admitted to the hospital 04/07/2016 for induction of labor.  Indication for induction: intraterine growth restriction.  Patient had an uncomplicated labor course as follows: Membrane Rupture Time/Date: 5:25 PM ,04/08/2016   Intrapartum Procedures: Episiotomy: None [1]                                         Lacerations:  None [1]  Patient had delivery of a Viable infant.  Information for the patient's newborn:  Aliani, Caccavale [[id number]]  Delivery Method: Vaginal, Spontaneous Delivery (Filed from Delivery Summary)   04/08/2016  Details of delivery can be found in separate delivery note.  Patient had a routine postpartum course. Patient is discharged home 04/08/2016.   Physical exam  Filed Vitals:   04/08/16 1355 04/08/16 1453 04/08/16 1553 04/08/16 1653  BP:  96/51 104/49 101/56  Pulse:  57 53 52  Temp:      TempSrc:      Resp:      Height:      Weight:      SpO2: 100%      General: alert, cooperative and no distress Lochia: appropriate Uterine Fundus: firm Incision: N/A DVT Evaluation: No evidence of DVT seen on physical exam. No cords or calf tenderness.  Labs: Lab Results  Component Value Date   WBC 11.7* 04/07/2016   HGB 10.7* 04/07/2016   HCT 31.4* 04/07/2016   MCV 84.2 04/07/2016   PLT 130*  04/07/2016   CMP Latest Ref Rng 10/16/2014  Glucose 65-99 mg/dL 130(Q)  BUN 6-57 mg/dL 6(L)  Creatinine 8.46-9.62 mg/dL 9.52(W)  Sodium 413-244 mmol/L 139  Potassium 3.5-5.1 mmol/L 3.7  Chloride 98-107 mmol/L 107  CO2 21-32 mmol/L 25  Calcium 8.5-10.1 mg/dL 8.3(L)  Total Protein 6.4-8.2 g/dL 7.1  Total Bilirubin 0.1-0.2 mg/dL 0.2  Alkaline Phos - 64  AST 15-37 Unit/L 21  ALT - 18   Postpartum Hct: 31.3  Discharge instruction:   Call office if you have any of the following: headache, visual changes, fever >100 F, chills, breast concerns, excessive vaginal bleeding, incision drainage or problems, leg pain or redness, depression or any other concerns.   Activity: Do not lift > 10 lbs for 6 weeks.  No intercourse or tampons for 6 weeks.  No driving for 1-2 weeks.   Medications:    Medication  List    ASK your doctor about these medications        busPIRone 7.5 MG tablet  Commonly known as:  BUSPAR  Take 7.5 mg by mouth 2 (two) times daily.     hydrOXYzine 50 MG tablet  Commonly known as:  ATARAX/VISTARIL  Take 50 mg by mouth every 4 (four) hours as needed.     prenatal multivitamin Tabs tablet  Take 1 tablet by mouth daily at 12 noon.        Diet: routine diet  Activity: Advance as tolerated. Pelvic rest for 6 weeks.   Outpatient follow up: Follow-up Information    Follow up with Conard NovakJackson, Stephen D, MD. Schedule an appointment as soon as possible for a visit in 6 weeks.   Specialty:  Obstetrics and Gynecology   Why:  postpartum check   Contact information:   [address] [place] [address] [phone]      Postpartum contraception: Nexplanon Rhogam Given postpartum: no Rubella vaccine given postpartum: no Varicella vaccine given postpartum: no TDaP given antepartum or postpartum: TDaP given 01/25/16 Flu vaccine given antepartum or postpartum: given 11/03/15  Newborn Data: Live born female  Birth Weight:  pending APGAR: 8, 9  Baby Feeding:  Breast  Disposition:home with mother  SIGNED: Marja Kays. Ronav Furney, CNM

## 2016-04-08 NOTE — Progress Notes (Signed)
Patient ID: Michael BostonBrittany Owens, female   DOB: Jul 25, 1991, 25 y.o.   MRN: [id number] Labor Check  Subj:  Complaints: None, mild ctx   Obj:  BP 125/75 mmHg  Pulse 57  Temp(Src) 98 F (36.7 C) (Oral)  Resp 16  Ht 5\' 2"  (1.575 m)  Wt 62.596 kg (138 lb)  BMI 25.23 kg/m2    Cervix: Dilation: 3 / Effacement (%): 50 / Station: -2  Baseline FHR: 130    Variability: moderate    Accelerations: present    Decelerations: absent Contractions: present frequency: 2-3 q 10 min  A/P: 25 y.o. [id number] female at 494w0d with Fetal Growth Restriction.  1.  Labor: start pitocin for induction now  2.  FWB: reassuring, Overall assessment: category 1  3.  GBS negative  4.  Pain: n/a, epidural prn 5.  Recheck: 4-6 hours   Thomasene MohairStephen Akela Pocius, MD 04/08/2016 10:33 AM

## 2016-04-08 NOTE — Progress Notes (Signed)
Labor Check  Subj:  Complaints: comfortable with epidural   Obj:  BP 101/56 mmHg  Pulse 52  Temp(Src) 98 F (36.7 C) (Oral)  Resp 16  Ht 5\' 2"  (1.575 m)  Wt 62.596 kg (138 lb)  BMI 25.23 kg/m2  SpO2 100% Dose (milli-units/min) Oxytocin: 6 milli-units/min  Cervix: Dilation: 4 / Effacement (%): 50 / Station: -2  Baseline FHR: 130    Variability: moderate    Accelerations: present    Decelerations: absent Contractions: present  frequency: 3-4 q 10 min  A/P: 25 y.o. [id number] female at 8130w0d with FGR.  1.  Labor: AROM for particulate thick meconium.  Half pit dose  2.  FWB: reassuring overall, Overall assessment: category 1  3.  GBS negative  4.  Pain: epidural 5.  Recheck: 2 hour or prn   Thomasene MohairStephen Deni Berti, MD 04/08/2016 6:04 PM

## 2016-04-08 NOTE — Anesthesia Preprocedure Evaluation (Signed)
Anesthesia Evaluation   Patient awake and Patient confused    Reviewed: H&P , NPO status , Patient's Chart, lab work & pertinent test results  History of Anesthesia Complications Negative for: history of anesthetic complications  Airway Mallampati: I  TM Distance: <3 FB Neck ROM: full    Dental no notable dental hx.    Pulmonary neg pulmonary ROS, former smoker,    Pulmonary exam normal        Cardiovascular negative cardio ROS Normal cardiovascular exam     Neuro/Psych negative neurological ROS  negative psych ROS   GI/Hepatic negative GI ROS, Neg liver ROS,   Endo/Other  negative endocrine ROS  Renal/GU negative Renal ROS  negative genitourinary   Musculoskeletal   Abdominal   Peds  Hematology negative hematology ROS (+)   Anesthesia Other Findings   Reproductive/Obstetrics (+) Pregnancy                             Anesthesia Physical Anesthesia Plan  ASA: II  Anesthesia Plan:    Post-op Pain Management:    Induction:   Airway Management Planned:   Additional Equipment:   Intra-op Plan:   Post-operative Plan:   Informed Consent: I have reviewed the patients History and Physical, chart, labs and discussed the procedure including the risks, benefits and alternatives for the proposed anesthesia with the patient or authorized representative who has indicated his/her understanding and acceptance.     Plan Discussed with: CRNA and Anesthesiologist  Anesthesia Plan Comments:         Anesthesia Quick Evaluation

## 2016-04-09 LAB — CBC
HCT: 31.3 % — ABNORMAL LOW (ref 35.0–47.0)
HEMOGLOBIN: 10.7 g/dL — AB (ref 12.0–16.0)
MCH: 28.8 pg (ref 26.0–34.0)
MCHC: 34.2 g/dL (ref 32.0–36.0)
MCV: 84.2 fL (ref 80.0–100.0)
Platelets: 101 10*3/uL — ABNORMAL LOW (ref 150–440)
RBC: 3.72 MIL/uL — AB (ref 3.80–5.20)
RDW: 15.5 % — ABNORMAL HIGH (ref 11.5–14.5)
WBC: 9.2 10*3/uL (ref 3.6–11.0)

## 2016-04-09 LAB — RPR: RPR Ser Ql: NONREACTIVE

## 2016-04-09 NOTE — Anesthesia Postprocedure Evaluation (Signed)
Anesthesia Post Note  Patient: Hailey BostonBrittany Devino  Procedure(s) Performed: * No procedures listed *  Patient location during evaluation: Mother Baby Anesthesia Type: Epidural Level of consciousness: awake and alert Pain management: pain level controlled Vital Signs Assessment: post-procedure vital signs reviewed and stable Respiratory status: spontaneous breathing, nonlabored ventilation and respiratory function stable Cardiovascular status: stable Postop Assessment: no headache, no backache and epidural receding Anesthetic complications: no    Last Vitals:  Filed Vitals:   04/09/16 0146 04/09/16 0415  BP: 120/79 121/75  Pulse: 63 50  Temp: 37.1 C 36.8 C  Resp: 20 18    Last Pain:  Filed Vitals:   04/09/16 0415  PainSc: 0-No pain                 Cleda MccreedyJoseph K Reathel Turi

## 2016-04-09 NOTE — Anesthesia Post-op Follow-up Note (Signed)
  Anesthesia Pain Follow-up Note  Patient: Hailey BostonBrittany Owens  Day #: 1  Date of Follow-up: 04/09/2016 Time: 7:23 AM  Last Vitals:  Filed Vitals:   04/09/16 0146 04/09/16 0415  BP: 120/79 121/75  Pulse: 63 50  Temp: 37.1 C 36.8 C  Resp: 20 18    Level of Consciousness: alert  Pain: mild   Side Effects:None  Catheter Site Exam:clean  Plan: D/C from anesthesia care  Cleda MccreedyJoseph K Piscitello

## 2016-04-09 NOTE — Progress Notes (Signed)
  Postpartum Day 1  Subjective: no complaints, up ad lib, voiding and tolerating PO  Objective: Blood pressure 103/66, pulse 82, temperature 98.5 F (36.9 C), temperature source Oral, resp. rate 20, height 5\' 2"  (1.575 m), weight 138 lb (62.596 kg), SpO2 98 %  Physical Exam:  General: alert and cooperative Lochia: appropriate Uterine Fundus: firm Incision: N/A DVT Evaluation: No evidence of DVT seen on physical exam. Abdomen: soft, NT   Recent Labs  04/07/16 2205 04/09/16 0506  HGB 10.7* 10.7*  HCT 31.4* 31.3*    Assessment PPD #1  Plan: Discharge tomorrow, Continue PP care and Advance activity as tolerated  Feeding: breast Contraception: Nexplanon Blood Type: O+ RI/VI TDAP UTD   Hailey Owens, Hailey Owens, [place] 04/09/2016, 12:06 PM

## 2016-04-10 NOTE — Progress Notes (Signed)
Discharge instructions complete. Patient verbalizes understanding of teaching. Patient discharged home at 1130.

## 2016-04-10 NOTE — Discharge Instructions (Signed)
Discharge instructions:  ° °Call office if you have any of the following: headache, visual changes, fever >100 F, chills, breast concerns, excessive vaginal bleeding, incision drainage or problems, leg pain or redness, depression or any other concerns.  ° °Activity: Do not lift > 10 lbs for 6 weeks.  °No intercourse or tampons for 6 weeks.  °No driving for 1-2 weeks.  ° °

## 2016-04-13 LAB — SURGICAL PATHOLOGY
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
## 2016-08-07 IMAGING — US US OB < 14 WEEKS - US OB TV
1 series · 14 of 28 positions shown · non-contrast
Comparison: 06/29/2014.

CLINICAL DATA: Pelvic pain.  Positive pregnancy test.

EXAM:
OBSTETRIC <14 WK US AND TRANSVAGINAL OB US
TECHNIQUE: Both transabdominal and transvaginal ultrasound examinations were
performed for complete evaluation of the gestation as well as the
maternal uterus, adnexal regions, and pelvic cul-de-sac.
Transvaginal technique was performed to assess early pregnancy.

[Series 1: us ob < 14 weeks - us ob tv · 0.21mm/px · 14 of 67 slices shown]
[im 3/67]
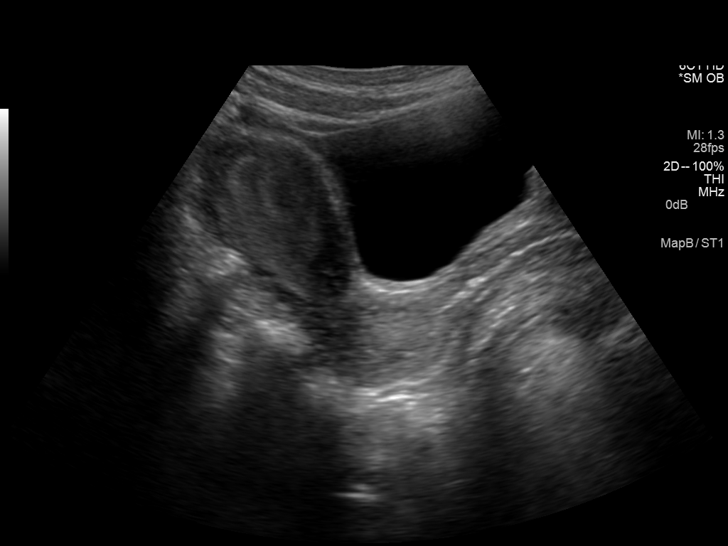
[im 8/67]
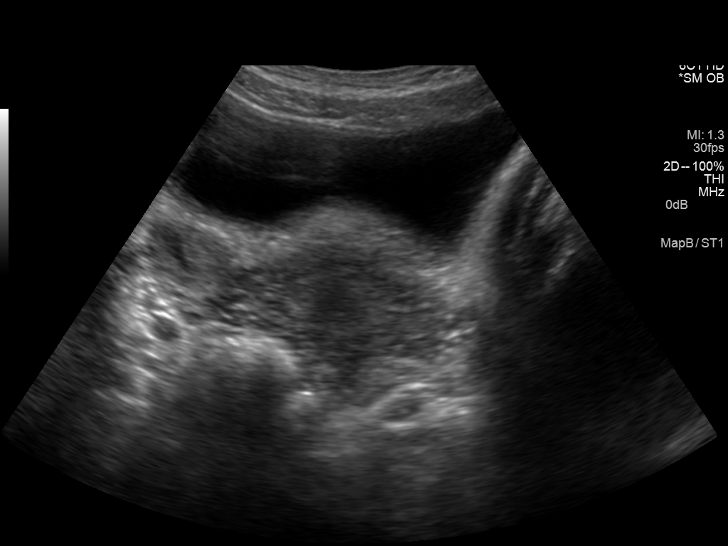
[im 13/67]
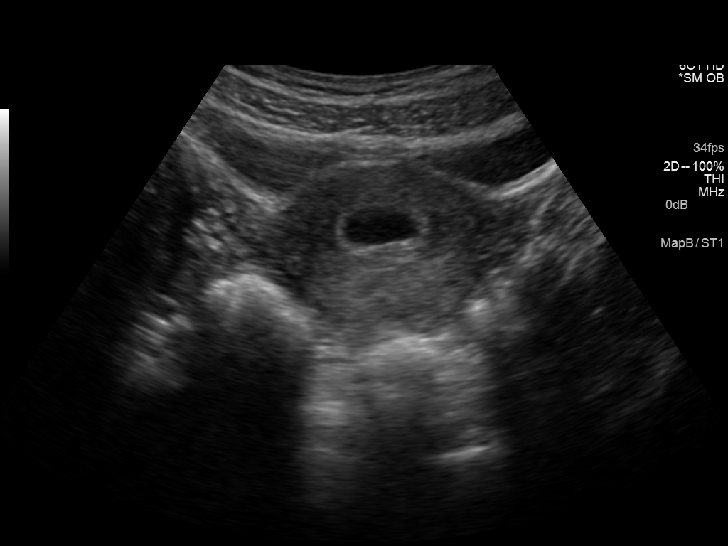
[im 18/67]
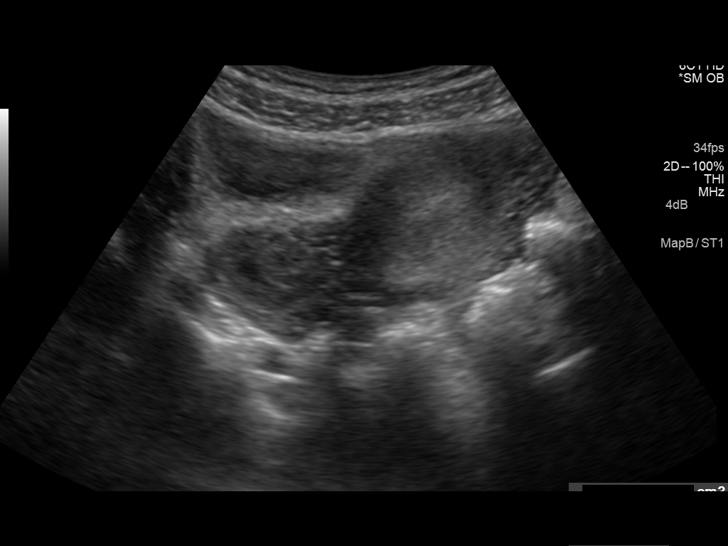
[im 23/67]
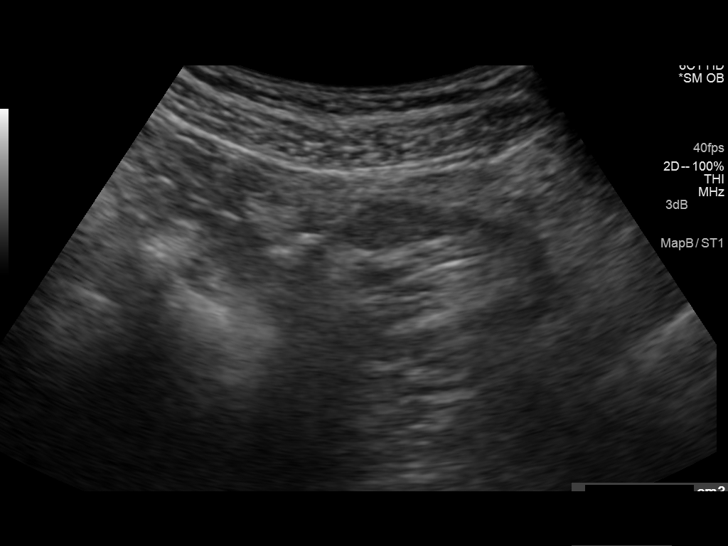
[im 27/67]
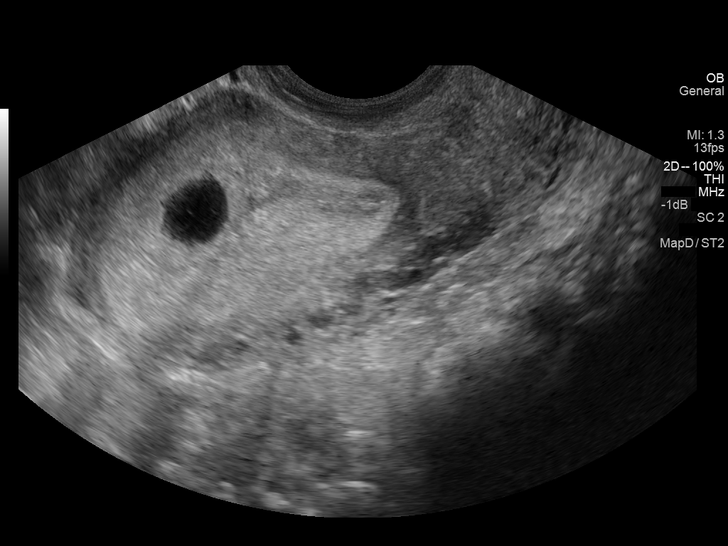
[im 32/67]
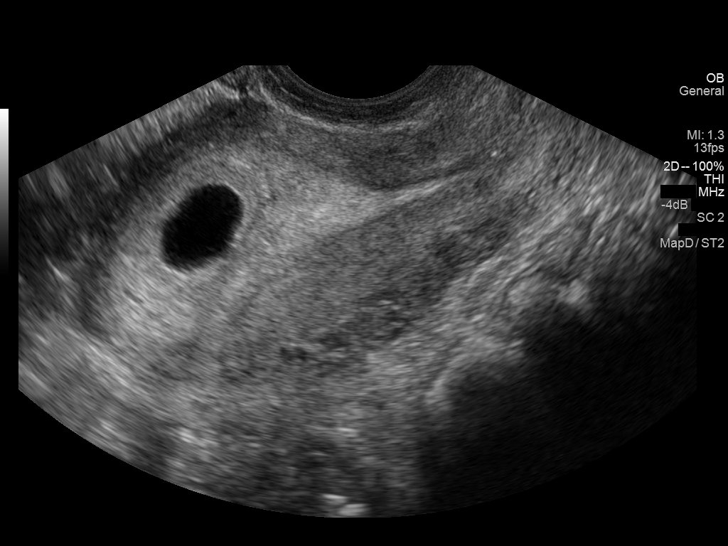
[im 37/67]
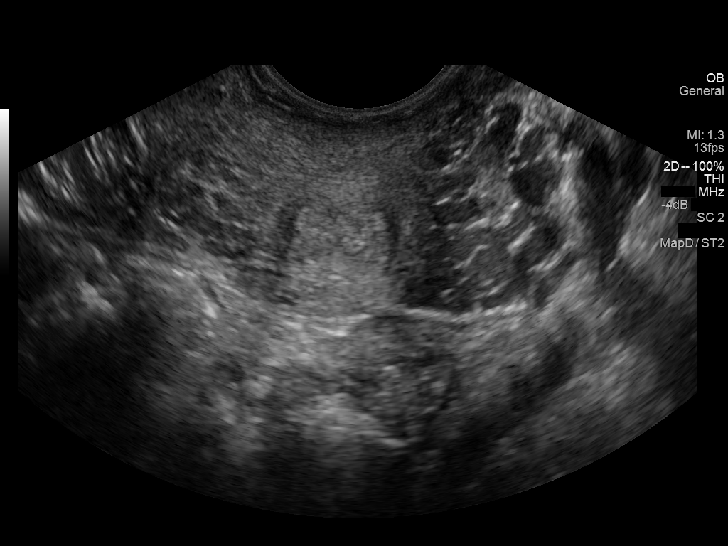
[im 42/67]
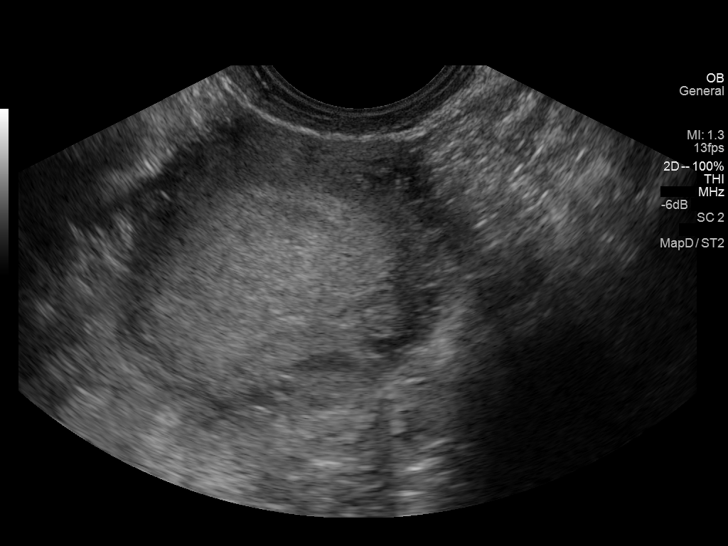
[im 47/67]
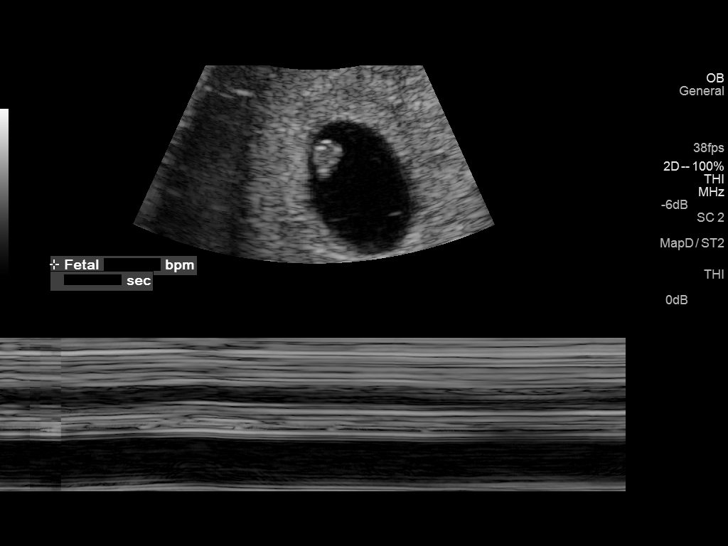
[im 52/67]
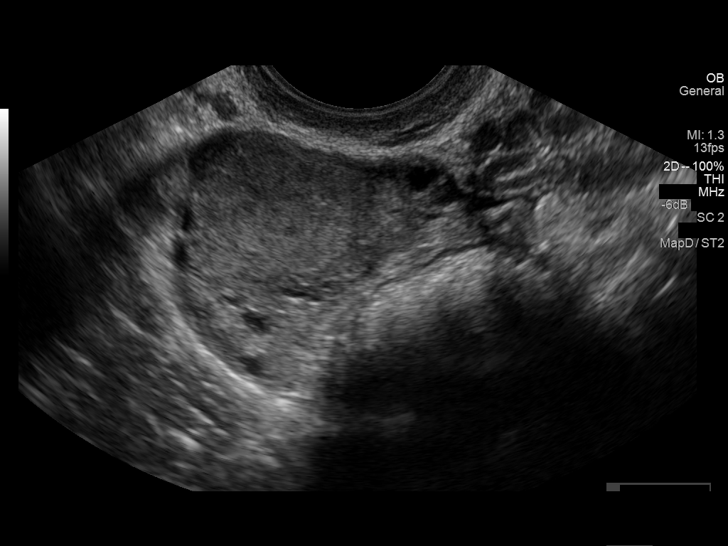
[im 57/67]
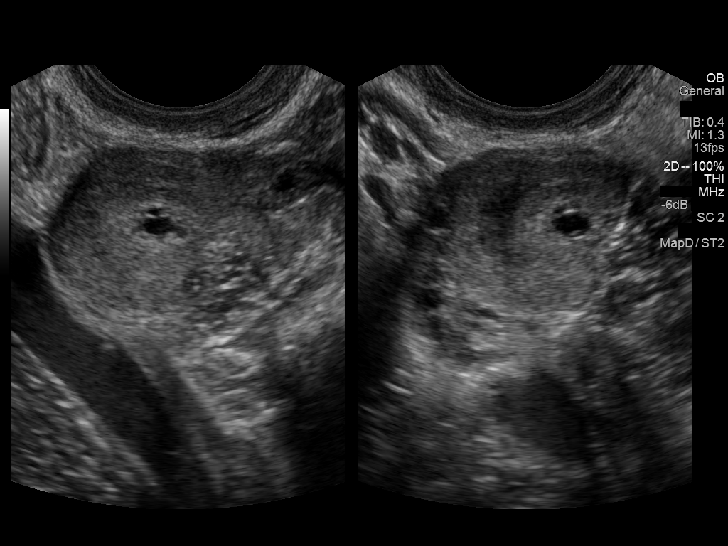
[im 62/67]
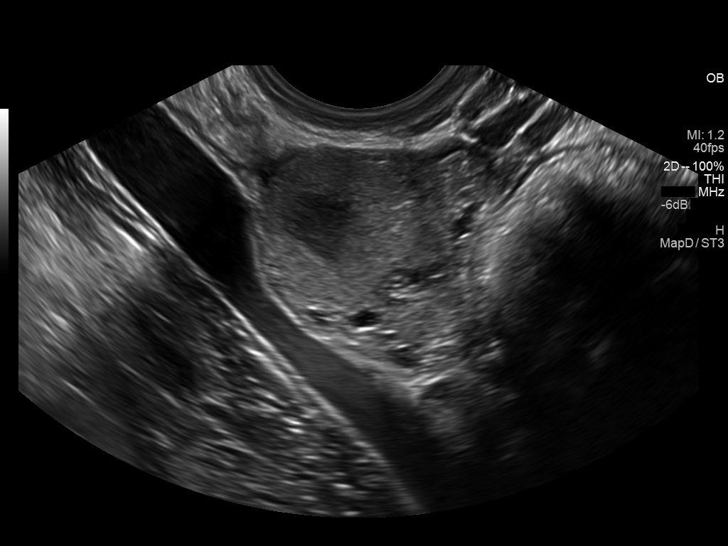
[im 67/67]
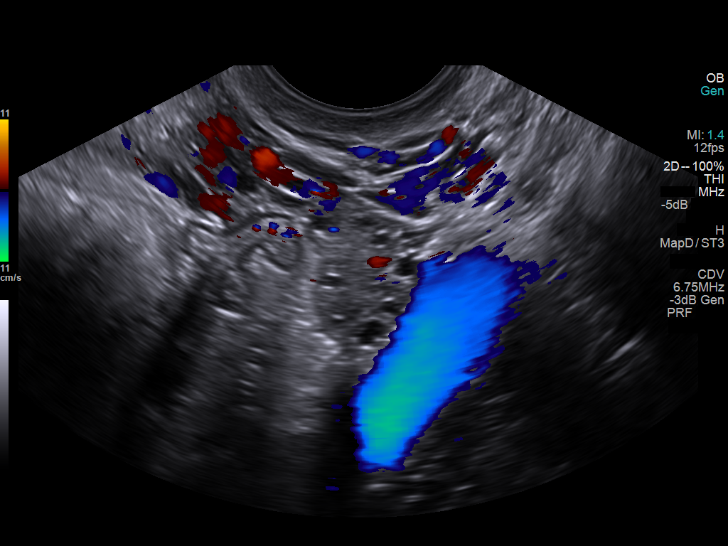

[14 of 28 positions shown; findings below may reference images not displayed]

FINDINGS: Intrauterine gestational sac: Visualized/normal in shape.

Yolk sac:  Visualized/normal in shape.

Embryo:  Visualized

Cardiac Activity: Visualized

Heart Rate:  114 bpm

CRL:   3.2  mm   6 w 0 d                  US EDC: 06/11/2015

Maternal uterus/adnexae: Normal appearing maternal ovaries with a
right corpus luteum noted. No subchorionic hemorrhage or free
peritoneal fluid.
IMPRESSION: Single live intrauterine gestation with an estimated gestational age
of 6 weeks and 0 days. No complicating features.
